# Patient Record
Sex: Female | Born: 1950 | Race: White | Hispanic: No | Marital: Married | Smoking: Former smoker
Health system: Southern US, Community
[De-identification: ages and names within clinical notes are randomized; demographics above are authoritative.]

## PROBLEM LIST (undated history)

## (undated) DIAGNOSIS — E119 Type 2 diabetes mellitus without complications: Secondary | ICD-10-CM

## (undated) DIAGNOSIS — I10 Essential (primary) hypertension: Secondary | ICD-10-CM

## (undated) DIAGNOSIS — M1990 Unspecified osteoarthritis, unspecified site: Secondary | ICD-10-CM

## (undated) DIAGNOSIS — Z87442 Personal history of urinary calculi: Secondary | ICD-10-CM

## (undated) DIAGNOSIS — E785 Hyperlipidemia, unspecified: Secondary | ICD-10-CM

## (undated) DIAGNOSIS — E041 Nontoxic single thyroid nodule: Secondary | ICD-10-CM

## (undated) DIAGNOSIS — T753XXA Motion sickness, initial encounter: Secondary | ICD-10-CM

## (undated) DIAGNOSIS — K219 Gastro-esophageal reflux disease without esophagitis: Secondary | ICD-10-CM

## (undated) HISTORY — PX: COLONOSCOPY: SHX174

## (undated) HISTORY — PX: CYSTOSCOPY WITH HOLMIUM LASER LITHOTRIPSY: SHX6639

## (undated) HISTORY — PX: OVARIAN CYST REMOVAL: SHX89

---

## 2004-02-04 ENCOUNTER — Other Ambulatory Visit: Payer: Self-pay

## 2005-03-20 ENCOUNTER — Ambulatory Visit: Payer: Self-pay | Admitting: Obstetrics and Gynecology

## 2006-03-18 ENCOUNTER — Ambulatory Visit: Payer: Self-pay | Admitting: Gastroenterology

## 2006-03-25 ENCOUNTER — Ambulatory Visit: Payer: Self-pay | Admitting: Obstetrics and Gynecology

## 2007-04-18 ENCOUNTER — Ambulatory Visit: Payer: Self-pay | Admitting: Obstetrics and Gynecology

## 2008-04-23 ENCOUNTER — Ambulatory Visit: Payer: Self-pay | Admitting: Obstetrics and Gynecology

## 2009-04-26 ENCOUNTER — Ambulatory Visit: Payer: Self-pay | Admitting: Obstetrics and Gynecology

## 2010-05-08 ENCOUNTER — Ambulatory Visit: Payer: Self-pay | Admitting: Obstetrics and Gynecology

## 2010-05-10 ENCOUNTER — Ambulatory Visit: Payer: Self-pay | Admitting: Obstetrics and Gynecology

## 2010-10-30 ENCOUNTER — Ambulatory Visit: Payer: Self-pay | Admitting: Obstetrics and Gynecology

## 2011-05-17 ENCOUNTER — Ambulatory Visit: Payer: Self-pay | Admitting: Obstetrics and Gynecology

## 2011-05-23 ENCOUNTER — Ambulatory Visit: Payer: Self-pay | Admitting: Obstetrics and Gynecology

## 2011-11-27 ENCOUNTER — Ambulatory Visit: Payer: Self-pay | Admitting: Gastroenterology

## 2011-12-11 ENCOUNTER — Ambulatory Visit: Payer: Self-pay | Admitting: Internal Medicine

## 2011-12-18 ENCOUNTER — Ambulatory Visit: Payer: Self-pay | Admitting: Gastroenterology

## 2012-06-10 ENCOUNTER — Ambulatory Visit: Payer: Self-pay | Admitting: Internal Medicine

## 2013-06-11 ENCOUNTER — Ambulatory Visit: Payer: Self-pay | Admitting: Internal Medicine

## 2014-08-19 ENCOUNTER — Ambulatory Visit: Payer: Self-pay | Admitting: Internal Medicine

## 2015-07-19 ENCOUNTER — Other Ambulatory Visit: Payer: Self-pay | Admitting: Internal Medicine

## 2015-07-19 DIAGNOSIS — Z1231 Encounter for screening mammogram for malignant neoplasm of breast: Secondary | ICD-10-CM

## 2015-08-22 ENCOUNTER — Ambulatory Visit
Admission: RE | Admit: 2015-08-22 | Discharge: 2015-08-22 | Disposition: A | Discharge status: Home / Self Care | Payer: Medicare HMO | Source: Ambulatory Visit | Attending: Internal Medicine | Admitting: Internal Medicine

## 2015-08-22 ENCOUNTER — Other Ambulatory Visit: Payer: Self-pay | Admitting: Internal Medicine

## 2015-08-22 DIAGNOSIS — Z1231 Encounter for screening mammogram for malignant neoplasm of breast: Secondary | ICD-10-CM | POA: Diagnosis present

## 2016-07-18 ENCOUNTER — Other Ambulatory Visit: Payer: Self-pay | Admitting: Internal Medicine

## 2016-07-18 DIAGNOSIS — Z1231 Encounter for screening mammogram for malignant neoplasm of breast: Secondary | ICD-10-CM

## 2016-08-24 ENCOUNTER — Ambulatory Visit
Admission: RE | Admit: 2016-08-24 | Discharge: 2016-08-24 | Disposition: A | Discharge status: Home / Self Care | Payer: Medicare HMO | Source: Ambulatory Visit | Attending: Internal Medicine | Admitting: Internal Medicine

## 2016-08-24 DIAGNOSIS — Z1231 Encounter for screening mammogram for malignant neoplasm of breast: Secondary | ICD-10-CM | POA: Diagnosis not present

## 2016-09-17 ENCOUNTER — Telehealth: Payer: Self-pay

## 2016-09-17 ENCOUNTER — Other Ambulatory Visit: Payer: Self-pay

## 2016-09-17 DIAGNOSIS — F419 Anxiety disorder, unspecified: Secondary | ICD-10-CM | POA: Insufficient documentation

## 2016-09-17 DIAGNOSIS — E040 Nontoxic diffuse goiter: Secondary | ICD-10-CM | POA: Insufficient documentation

## 2016-09-17 DIAGNOSIS — I10 Essential (primary) hypertension: Secondary | ICD-10-CM | POA: Insufficient documentation

## 2016-09-17 DIAGNOSIS — E785 Hyperlipidemia, unspecified: Secondary | ICD-10-CM | POA: Insufficient documentation

## 2016-09-17 DIAGNOSIS — K219 Gastro-esophageal reflux disease without esophagitis: Secondary | ICD-10-CM | POA: Insufficient documentation

## 2016-09-17 DIAGNOSIS — E119 Type 2 diabetes mellitus without complications: Secondary | ICD-10-CM | POA: Insufficient documentation

## 2016-09-17 NOTE — Telephone Encounter (Signed)
Gastroenterology Pre-Procedure Review  Request Date:  Requesting Physician: Dr.   PATIENT REVIEW QUESTIONS: The patient responded to the following health history questions as indicated:    1. Are you having any GI issues? no 2. Do you have a personal history of Polyps? no 3. Do you have a family history of Colon Cancer or Polyps? no 4. Diabetes Mellitus? yes (Type 2) 5. Joint replacements in the past 12 months?no 6. Major health problems in the past 3 months?no 7. Any artificial heart valves, MVP, or defibrillator?no    MEDICATIONS & ALLERGIES:    Patient reports the following regarding taking any anticoagulation/antiplatelet therapy:   Plavix, Coumadin, Eliquis, Xarelto, Lovenox, Pradaxa, Brilinta, or Effient? no Aspirin? no  Patient confirms/reports the following medications:  Current Outpatient Prescriptions  Medication Sig Dispense Refill  . ALPRAZolam (XANAX) 0.25 MG tablet TAKE 1 TABLET BY MOUTH EVERY DAY AS NEEDED    . glipiZIDE (GLUCOTROL XL) 5 MG 24 hr tablet Take by mouth.    . losartan-hydrochlorothiazide (HYZAAR) 100-12.5 MG tablet TAKE 1 TABLET BY MOUTH EVERY DAY    . metFORMIN (GLUCOPHAGE) 1000 MG tablet Take by mouth.    . mometasone (ELOCON) 0.1 % cream Apply topically.    Marland Kitchen omeprazole (PRILOSEC) 40 MG capsule Take by mouth.    . simvastatin (ZOCOR) 80 MG tablet TAKE 1 TABLET(80 MG) BY MOUTH EVERY DAY    . valACYclovir (VALTREX) 1000 MG tablet 1 tab po qd x 2 days    . Vitamin D, Ergocalciferol, (DRISDOL) 50000 units CAPS capsule TAKE 1 CAPSULE BY MOUTH ONCE A WEEK    . BIONECT 0.2 % CREA APPLY TO THE AFFECTED AREA TOPICALLY EVERY DAY  0  . metFORMIN (GLUCOPHAGE) 1000 MG tablet     . Multiple Vitamin (MULTI-VITAMINS) TABS Take by mouth.    Marland Kitchen omeprazole (PRILOSEC) 40 MG capsule   1  . simvastatin (ZOCOR) 80 MG tablet      No current facility-administered medications for this visit.     Patient confirms/reports the following allergies:  Allergies not on  file  No orders of the defined types were placed in this encounter.   AUTHORIZATION INFORMATION Primary Insurance: 1D#: Group #:  Secondary Insurance: 1D#: Group #:  SCHEDULE INFORMATION: Date: 10/22/16 Time: Location: [place]

## 2016-09-24 ENCOUNTER — Encounter: Admission: RE | Payer: Self-pay | Source: Ambulatory Visit

## 2016-09-24 ENCOUNTER — Ambulatory Visit: Admission: RE | Admit: 2016-09-24 | Payer: Medicare HMO | Source: Ambulatory Visit | Admitting: Gastroenterology

## 2016-09-24 SURGERY — COLONOSCOPY WITH PROPOFOL
Anesthesia: General

## 2016-10-16 ENCOUNTER — Encounter: Payer: Self-pay | Admitting: *Deleted

## 2016-10-19 NOTE — Discharge Instructions (Signed)

## 2016-10-22 ENCOUNTER — Ambulatory Visit
Admission: RE | Admit: 2016-10-22 | Discharge: 2016-10-22 | Disposition: A | Discharge status: Home / Self Care | Payer: Medicare HMO | Source: Ambulatory Visit | Attending: Gastroenterology | Admitting: Gastroenterology

## 2016-10-22 ENCOUNTER — Ambulatory Visit: Payer: Medicare HMO | Admitting: Anesthesiology

## 2016-10-22 ENCOUNTER — Encounter
Admission: RE | Disposition: A | Discharge status: Home / Self Care | Payer: Self-pay | Source: Ambulatory Visit | Attending: Gastroenterology

## 2016-10-22 DIAGNOSIS — D124 Benign neoplasm of descending colon: Secondary | ICD-10-CM | POA: Insufficient documentation

## 2016-10-22 DIAGNOSIS — Z7984 Long term (current) use of oral hypoglycemic drugs: Secondary | ICD-10-CM | POA: Diagnosis not present

## 2016-10-22 DIAGNOSIS — M1990 Unspecified osteoarthritis, unspecified site: Secondary | ICD-10-CM | POA: Diagnosis not present

## 2016-10-22 DIAGNOSIS — Z87891 Personal history of nicotine dependence: Secondary | ICD-10-CM | POA: Diagnosis not present

## 2016-10-22 DIAGNOSIS — I10 Essential (primary) hypertension: Secondary | ICD-10-CM | POA: Diagnosis not present

## 2016-10-22 DIAGNOSIS — E785 Hyperlipidemia, unspecified: Secondary | ICD-10-CM | POA: Insufficient documentation

## 2016-10-22 DIAGNOSIS — K640 First degree hemorrhoids: Secondary | ICD-10-CM | POA: Insufficient documentation

## 2016-10-22 DIAGNOSIS — K5730 Diverticulosis of large intestine without perforation or abscess without bleeding: Secondary | ICD-10-CM | POA: Diagnosis not present

## 2016-10-22 DIAGNOSIS — Z1211 Encounter for screening for malignant neoplasm of colon: Secondary | ICD-10-CM | POA: Diagnosis not present

## 2016-10-22 DIAGNOSIS — D125 Benign neoplasm of sigmoid colon: Secondary | ICD-10-CM

## 2016-10-22 DIAGNOSIS — K219 Gastro-esophageal reflux disease without esophagitis: Secondary | ICD-10-CM | POA: Insufficient documentation

## 2016-10-22 DIAGNOSIS — E119 Type 2 diabetes mellitus without complications: Secondary | ICD-10-CM | POA: Insufficient documentation

## 2016-10-22 DIAGNOSIS — Z79899 Other long term (current) drug therapy: Secondary | ICD-10-CM | POA: Insufficient documentation

## 2016-10-22 DIAGNOSIS — D123 Benign neoplasm of transverse colon: Secondary | ICD-10-CM | POA: Diagnosis not present

## 2016-10-22 DIAGNOSIS — K635 Polyp of colon: Secondary | ICD-10-CM

## 2016-10-22 HISTORY — DX: Hyperlipidemia, unspecified: E78.5

## 2016-10-22 HISTORY — DX: Gastro-esophageal reflux disease without esophagitis: K21.9

## 2016-10-22 HISTORY — DX: Type 2 diabetes mellitus without complications: E11.9

## 2016-10-22 HISTORY — DX: Personal history of urinary calculi: Z87.442

## 2016-10-22 HISTORY — DX: Essential (primary) hypertension: I10

## 2016-10-22 HISTORY — DX: Unspecified osteoarthritis, unspecified site: M19.90

## 2016-10-22 HISTORY — DX: Nontoxic single thyroid nodule: E04.1

## 2016-10-22 HISTORY — DX: Motion sickness, initial encounter: T75.3XXA

## 2016-10-22 HISTORY — PX: COLONOSCOPY WITH PROPOFOL: SHX5780

## 2016-10-22 HISTORY — PX: POLYPECTOMY: SHX5525

## 2016-10-22 LAB — GLUCOSE, CAPILLARY: GLUCOSE-CAPILLARY: 199 mg/dL — AB (ref 65–99)

## 2016-10-22 SURGERY — COLONOSCOPY WITH PROPOFOL
Anesthesia: Choice | Wound class: Contaminated

## 2016-10-22 MED ORDER — ACETAMINOPHEN 325 MG PO TABS: 325.0000 mg | ORAL_TABLET | ORAL | Status: DC | PRN

## 2016-10-22 MED ORDER — ACETAMINOPHEN 160 MG/5ML PO SOLN: 325.0000 mg | ORAL | Status: DC | PRN

## 2016-10-22 MED ORDER — LIDOCAINE HCL (CARDIAC) 20 MG/ML IV SOLN
INTRAVENOUS | Status: DC | PRN
  Administered 2016-10-22: 20 mg via INTRAVENOUS

## 2016-10-22 MED ORDER — STERILE WATER FOR IRRIGATION IR SOLN
Status: DC | PRN
  Administered 2016-10-22: 08:00:00

## 2016-10-22 MED ORDER — LACTATED RINGERS IV SOLN
INTRAVENOUS | Status: DC
  Administered 2016-10-22 (×2): via INTRAVENOUS

## 2016-10-22 MED ORDER — PROPOFOL 10 MG/ML IV BOLUS
INTRAVENOUS | Status: DC | PRN
  Administered 2016-10-22: 20 mg via INTRAVENOUS
  Administered 2016-10-22: 30 mg via INTRAVENOUS
  Administered 2016-10-22: 20 mg via INTRAVENOUS
  Administered 2016-10-22: 80 mg via INTRAVENOUS
  Administered 2016-10-22 (×2): 20 mg via INTRAVENOUS
  Administered 2016-10-22: 40 mg via INTRAVENOUS

## 2016-10-22 SURGICAL SUPPLY — 23 items

## 2016-10-22 NOTE — Anesthesia Preprocedure Evaluation (Signed)
Anesthesia Evaluation  Patient identified by MRN, date of birth, ID band Patient awake    Reviewed: Allergy & Precautions, H&P , NPO status , Patient's Chart, lab work & pertinent test results  Airway Mallampati: II  TM Distance: >3 FB Neck ROM: full    Dental no notable dental hx.    Pulmonary former smoker,    Pulmonary exam normal        Cardiovascular hypertension, Normal cardiovascular exam     Neuro/Psych    GI/Hepatic GERD  ,  Endo/Other  diabetesMorbid obesity  Renal/GU      Musculoskeletal   Abdominal   Peds  Hematology   Anesthesia Other Findings   Reproductive/Obstetrics                             Anesthesia Physical Anesthesia Plan  ASA: II  Anesthesia Plan:    Post-op Pain Management:    Induction:   Airway Management Planned:   Additional Equipment:   Intra-op Plan:   Post-operative Plan:   Informed Consent: I have reviewed the patients History and Physical, chart, labs and discussed the procedure including the risks, benefits and alternatives for the proposed anesthesia with the patient or authorized representative who has indicated his/her understanding and acceptance.     Plan Discussed with:   Anesthesia Plan Comments:         Anesthesia Quick Evaluation

## 2016-10-22 NOTE — Op Note (Signed)
[facility] Gastroenterology Patient Name: Victoria Boone Procedure Date: 10/22/2016 8:12 AM MRN: [id number] Account #: [id number] Date of Birth: [birth year] Admit Type: Outpatient Age: 66 Room: [facility] OR ROOM 01 Gender: Female Note Status: Finalized Procedure:            Colonoscopy Indications:          Screening for colorectal malignant neoplasm Providers:            Lucilla Lame MD, MD Referring MD:         Tracie Harrier, MD (Referring MD) Medicines:            Propofol per Anesthesia Complications:        No immediate complications. Procedure:            Pre-Anesthesia Assessment:                       - Prior to the procedure, a History and Physical was                        performed, and patient medications and allergies were                        reviewed. The patient's tolerance of previous                        anesthesia was also reviewed. The risks and benefits of                        the procedure and the sedation options and risks were                        discussed with the patient. All questions were                        answered, and informed consent was obtained. Prior                        Anticoagulants: The patient has taken no previous                        anticoagulant or antiplatelet agents. ASA Grade                        Assessment: II - A patient with mild systemic disease.                        After reviewing the risks and benefits, the patient was                        deemed in satisfactory condition to undergo the                        procedure.                       After obtaining informed consent, the colonoscope was                        passed under direct vision. Throughout the procedure,  the patient's blood pressure, pulse, and oxygen                        saturations were monitored continuously. The [place] [id number]) was introduced through  the                        anus and advanced to the the cecum, identified by                        appendiceal orifice and ileocecal valve. The                        colonoscopy was performed without difficulty. The                        patient tolerated the procedure well. The quality of                        the bowel preparation was excellent. Findings:      The perianal and digital rectal examinations were normal.      A 5 mm polyp was found in the transverse colon. The polyp was sessile.       The polyp was removed with a cold snare. Resection and retrieval were       complete.      A 5 mm polyp was found in the descending colon. The polyp was sessile.       The polyp was removed with a cold snare. Resection and retrieval were       complete.      A 3 mm polyp was found in the sigmoid colon. The polyp was sessile. The       polyp was removed with a cold snare. Resection and retrieval were       complete.      Multiple small-mouthed diverticula were found in the sigmoid colon and       ascending colon.      Non-bleeding internal hemorrhoids were found during retroflexion. The       hemorrhoids were Grade I (internal hemorrhoids that do not prolapse). Impression:           - One 5 mm polyp in the transverse colon, removed with                        a cold snare. Resected and retrieved.                       - One 5 mm polyp in the descending colon, removed with                        a cold snare. Resected and retrieved.                       - One 3 mm polyp in the sigmoid colon, removed with a                        cold snare. Resected and retrieved.                       -  Diverticulosis in the sigmoid colon and in the                        ascending colon.                       - Non-bleeding internal hemorrhoids. Recommendation:       - Discharge patient to home.                       - Resume previous diet.                       - Continue present medications.                        - Await pathology results.                       - Repeat colonoscopy in 5 years if polyp adenoma and 10                        years if hyperplastic Procedure Code(s):    --- Professional ---                       [phone], Colonoscopy, flexible; with removal of tumor(s),                        polyp(s), or other lesion(s) by snare technique Diagnosis Code(s):    --- Professional ---                       Z12.11, Encounter for screening for malignant neoplasm                        of colon                       D12.3, Benign neoplasm of transverse colon (hepatic                        flexure or splenic flexure)                       D12.5, Benign neoplasm of sigmoid colon                       D12.4, Benign neoplasm of descending colon CPT copyright 2016 American Medical Association. All rights reserved. The codes documented in this report are preliminary and upon coder review may  be revised to meet current compliance requirements. Lucilla Lame MD, MD 10/22/2016 8:35:14 AM This report has been signed electronically. Number of Addenda: 0 Note Initiated On: 10/22/2016 8:12 AM Scope Withdrawal Time: 0 hours 9 minutes 37 seconds  Total Procedure Duration: 0 hours 14 minutes 4 seconds       [facility]

## 2016-10-22 NOTE — Anesthesia Procedure Notes (Signed)
Procedure Name: MAC Performed by: Dashiell Franchino Pre-anesthesia Checklist: Patient identified, Emergency Drugs available, Suction available, Patient being monitored and Timeout performed Patient Re-evaluated:Patient Re-evaluated prior to inductionOxygen Delivery Method: Nasal cannula       

## 2016-10-22 NOTE — H&P (Signed)
Lucilla Lame, MD [facility] [address], [place], [place] [address] Phone: [phone] Fax : [phone]  Primary Care Physician:  No primary care provider on file. Primary Gastroenterologist:  Dr. Allen Norris  Pre-Procedure History & Physical: HPI:  Victoria Boone is a 66 y.o. female is here for a screening colonoscopy.   Past Medical History:  Diagnosis Date  . Arthritis    left thumb  . Benign thyroid cyst   . Diabetes mellitus without complication ([place])   . GERD (gastroesophageal reflux disease)   . History of kidney stones   . Hyperlipidemia   . Hypertension   . Motion sickness    all moving vehicles    Past Surgical History:  Procedure Laterality Date  . COLONOSCOPY    . CYSTOSCOPY WITH HOLMIUM LASER LITHOTRIPSY    . OVARIAN CYST REMOVAL      Prior to Admission medications   Medication Sig Start Date End Date Taking? Authorizing Provider  ALPRAZolam (XANAX) 0.25 MG tablet TAKE 1 TABLET BY MOUTH EVERY DAY AS NEEDED 03/23/16  Yes Historical Provider, MD  BIONECT 0.2 % CREA APPLY TO THE AFFECTED AREA TOPICALLY EVERY DAY 08/09/16  Yes Historical Provider, MD  glipiZIDE (GLUCOTROL XL) 5 MG 24 hr tablet Take by mouth. 08/31/16 08/31/17 Yes Historical Provider, MD  KRILL OIL PO Take by mouth 2 (two) times daily.   Yes Historical Provider, MD  losartan-hydrochlorothiazide (HYZAAR) 100-12.5 MG tablet TAKE 1 TABLET BY MOUTH EVERY DAY 08/07/16  Yes Historical Provider, MD  metFORMIN (GLUCOPHAGE) 1000 MG tablet Take by mouth. 08/31/16  Yes Historical Provider, MD  mometasone (ELOCON) 0.1 % cream Apply topically. 01/19/16 01/18/17 Yes Historical Provider, MD  Multiple Vitamin (MULTI-VITAMINS) TABS Take by mouth.   Yes Historical Provider, MD  omeprazole (PRILOSEC) 40 MG capsule Take by mouth. 08/31/16  Yes Historical Provider, MD  Probiotic Product (PROBIOTIC PO) Take by mouth daily.   Yes Historical Provider, MD  simvastatin (ZOCOR) 80 MG tablet TAKE 1 TABLET(80 MG) BY MOUTH EVERY DAY  08/13/16  Yes Historical Provider, MD  Vitamin D, Ergocalciferol, (DRISDOL) 50000 units CAPS capsule TAKE 1 CAPSULE BY MOUTH ONCE A WEEK 02/01/16  Yes Historical Provider, MD  valACYclovir (VALTREX) 1000 MG tablet 1 tab po qd x 2 days 08/31/16   Historical Provider, MD    Allergies as of 09/17/2016  . (Not on File)    History reviewed. No pertinent family history.  Social History   Social History  . Marital status: Married    Spouse name: N/A  . Number of children: N/A  . Years of education: N/A   Occupational History  . Not on file.   Social History Main Topics  . Smoking status: Former Smoker    Quit date: 07/30/1996  . Smokeless tobacco: Never Used  . Alcohol use No  . Drug use: Unknown  . Sexual activity: Not on file   Other Topics Concern  . Not on file   Social History Narrative  . No narrative on file    Review of Systems: See HPI, otherwise negative ROS  Physical Exam: Ht 5\' 4"  (1.626 m)   Wt 200 lb (90.7 kg)   BMI 34.33 kg/m  General:   Alert,  pleasant and cooperative in NAD Head:  Normocephalic and atraumatic. Neck:  Supple; no masses or thyromegaly. Lungs:  Clear throughout to auscultation.    Heart:  Regular rate and rhythm. Abdomen:  Soft, nontender and nondistended. Normal bowel sounds, without guarding, and without rebound.  Neurologic:  Alert and  oriented x4;  grossly normal neurologically.  Impression/Plan: Victoria Boone is now here to undergo a screening colonoscopy.  Risks, benefits, and alternatives regarding colonoscopy have been reviewed with the patient.  Questions have been answered.  All parties agreeable.

## 2016-10-22 NOTE — Transfer of Care (Signed)
Immediate Anesthesia Transfer of Care Note  Patient: Victoria Boone  Procedure(s) Performed: Procedure(s) with comments: COLONOSCOPY WITH PROPOFOL (N/A) - diabetic - oral meds POLYPECTOMY  Patient Location: PACU  Anesthesia Type: No value filed.  Level of Consciousness: awake, alert  and patient cooperative  Airway and Oxygen Therapy: Patient Spontanous Breathing and Patient connected to supplemental oxygen  Post-op Assessment: Post-op Vital signs reviewed, Patient's Cardiovascular Status Stable, Respiratory Function Stable, Patent Airway and No signs of Nausea or vomiting  Post-op Vital Signs: Reviewed and stable  Complications: No apparent anesthesia complications

## 2016-10-22 NOTE — Anesthesia Postprocedure Evaluation (Signed)
Anesthesia Post Note  Patient: Victoria Boone  Procedure(s) Performed: Procedure(s) (LRB): COLONOSCOPY WITH PROPOFOL (N/A) POLYPECTOMY  Patient location during evaluation: PACU Level of consciousness: awake and alert and oriented Pain management: satisfactory to patient Vital Signs Assessment: post-procedure vital signs reviewed and stable Respiratory status: spontaneous breathing, nonlabored ventilation and respiratory function stable Cardiovascular status: blood pressure returned to baseline and stable Postop Assessment: Adequate PO intake and No signs of nausea or vomiting Anesthetic complications: no    Raliegh Ip

## 2016-10-23 ENCOUNTER — Encounter: Payer: Self-pay | Admitting: Gastroenterology

## 2016-10-24 ENCOUNTER — Encounter: Payer: Self-pay | Admitting: Gastroenterology

## 2016-10-25 ENCOUNTER — Encounter: Payer: Self-pay | Admitting: Gastroenterology

## 2016-11-02 ENCOUNTER — Telehealth: Payer: Self-pay | Admitting: Gastroenterology

## 2016-11-02 NOTE — Telephone Encounter (Signed)
Please call patient with colonsocopy results.

## 2016-11-02 NOTE — Telephone Encounter (Signed)
Pt notified results letter was mailed out. Reprinted letter and results and mailed again.

## 2016-11-13 ENCOUNTER — Other Ambulatory Visit: Payer: Self-pay

## 2017-07-18 ENCOUNTER — Other Ambulatory Visit: Payer: Self-pay | Admitting: Internal Medicine

## 2017-07-18 DIAGNOSIS — Z1231 Encounter for screening mammogram for malignant neoplasm of breast: Secondary | ICD-10-CM

## 2017-08-21 ENCOUNTER — Encounter: Payer: Self-pay | Admitting: Gastroenterology

## 2017-08-21 ENCOUNTER — Other Ambulatory Visit: Payer: Self-pay

## 2017-08-21 ENCOUNTER — Ambulatory Visit: Payer: Medicare HMO | Admitting: Gastroenterology

## 2017-08-21 ENCOUNTER — Encounter (INDEPENDENT_AMBULATORY_CARE_PROVIDER_SITE_OTHER): Payer: Self-pay

## 2017-08-21 VITALS — BP 152/69 | HR 97 | Ht 64.0 in | Wt 222.0 lb

## 2017-08-21 DIAGNOSIS — R1013 Epigastric pain: Secondary | ICD-10-CM

## 2017-08-21 DIAGNOSIS — R1084 Generalized abdominal pain: Secondary | ICD-10-CM | POA: Diagnosis not present

## 2017-08-21 NOTE — Progress Notes (Signed)
Primary Care Physician: Tracie Harrier, MD  Primary Gastroenterologist:  Dr. Lucilla Lame  Chief Complaint  Patient presents with  . Abdominal Pain  . Gas  . Diarrhea    HPI: Victoria Boone is a 67 y.o. female here after having abdominal pain that started 4 weeks ago.  The patient states she started having abdominal pain with some bloating and, given by retching and diarrhea.  The patient states that her symptoms are so bad she thought she was going to go to the emergency room.  The patient states in the last week she has gone back to normal without any problems.  There is no report of any unexplained weight loss black stools or bloody stools.  The patient has had a colonoscopy in the past that showed an adenomatous polyp and is not due for colonoscopy at this time.  The patient also reports that she has chronic pain in her left flank that is not associated with eating or drinking.  She also states the pain is not associated with bowel movements. The patient denies any sick contacts and reports that she is now back to her usual bowel habits at this time.  During the episode of abdominal pain with bloating for 3 week she had a lot of burping and passing gas from the bottom.  One night was particularly bad with her having a lot of retching with resulting diarrhea after that.  Current Outpatient Medications  Medication Sig Dispense Refill  . ALPRAZolam (XANAX) 0.25 MG tablet TAKE 1 TABLET BY MOUTH EVERY DAY AS NEEDED    . BIONECT 0.2 % CREA APPLY TO THE AFFECTED AREA TOPICALLY EVERY DAY  0  . Continuous Blood Gluc Receiver (FREESTYLE LIBRE READER) DEVI Use 1 kit as directed    . Continuous Blood Gluc Sensor (FREESTYLE LIBRE SENSOR SYSTEM) MISC Use 1 kit every 10 (ten) days    . glipiZIDE (GLUCOTROL XL) 5 MG 24 hr tablet Take by mouth.    Marland Kitchen KRILL OIL PO Take by mouth 2 (two) times daily.    Marland Kitchen losartan-hydrochlorothiazide (HYZAAR) 100-12.5 MG tablet TAKE 1 TABLET BY MOUTH EVERY DAY    .  metFORMIN (GLUCOPHAGE) 1000 MG tablet Take by mouth.    . Multiple Vitamin (MULTI-VITAMINS) TABS Take by mouth.    Marland Kitchen omeprazole (PRILOSEC) 40 MG capsule Take by mouth.    . simvastatin (ZOCOR) 80 MG tablet TAKE 1 TABLET(80 MG) BY MOUTH EVERY DAY    . TRULICITY 3.32 RJ/1.8AC SOPN INJECT THE CONTENTS OF ONE SYRINGE INTO THE SKIN EVERY WEEK.  3  . valACYclovir (VALTREX) 1000 MG tablet 1 tab po qd x 2 days    . Vitamin D, Ergocalciferol, (DRISDOL) 50000 units CAPS capsule TAKE 1 CAPSULE BY MOUTH ONCE A WEEK    . Probiotic Product (PROBIOTIC PO) Take by mouth daily.     No current facility-administered medications for this visit.     Allergies as of 08/21/2017 - Review Complete 08/21/2017  Allergen Reaction Noted  . Rofecoxib  12/23/2013  . Adhesive [tape] Rash 10/16/2016    ROS:  General: Negative for anorexia, weight loss, fever, chills, fatigue, weakness. ENT: Negative for hoarseness, difficulty swallowing , nasal congestion. CV: Negative for chest pain, angina, palpitations, dyspnea on exertion, peripheral edema.  Respiratory: Negative for dyspnea at rest, dyspnea on exertion, cough, sputum, wheezing.  GI: See history of present illness. GU:  Negative for dysuria, hematuria, urinary incontinence, urinary frequency, nocturnal urination.  Endo: Negative for unusual  weight change.    Physical Examination:   BP (!) 152/69   Pulse 97   Ht _0  (1.626 m)   Wt 222 lb (100.7 kg)   BMI 38.11 kg/m   General: Well-nourished, well-developed in no acute distress.  Eyes: No icterus. Conjunctivae pink. Mouth: Oropharyngeal mucosa moist and pink , no lesions erythema or exudate. Lungs: Clear to auscultation bilaterally. Non-labored. Heart: Regular rate and rhythm, no murmurs rubs or gallops.  Abdomen: Bowel sounds are normal, nontender, nondistended, no hepatosplenomegaly or masses, no abdominal bruits or hernia , no rebound or guarding.   Extremities: No lower extremity edema. No clubbing  or deformities. Neuro: Alert and oriented x 3.  Grossly intact. Skin: Warm and dry, no jaundice.   Psych: Alert and cooperative, normal mood and affect.  Labs:    Imaging Studies: No results found.  Assessment and Plan:   Victoria Boone is a 67 y.o. y/o female who comes in today with a history of symptoms starting approximately 4 weeks ago with abdominal bloating and pain with some retching and diarrhea.  The patient states her symptoms have been gone for the last week.  She also reports that she started to take a new medication for her  Diabetes round the time this started.  The medication was Trulicity.  She has not stopped the medication. The patient has been told to double up on her omeprazole if her burping should come back and to contact me if the abdominal pain and her other symptoms return.  Otherwise the patient has been getting better and we will hold off on any invasive studies at this time.  The patient has been explained the plan and agrees with it.    Lucilla Lame, MD. Marval Regal   Note: This dictation was prepared with Dragon dictation along with smaller phrase technology. Any transcriptional errors that result from this process are unintentional.

## 2017-08-27 ENCOUNTER — Ambulatory Visit
Admission: RE | Admit: 2017-08-27 | Discharge: 2017-08-27 | Disposition: A | Discharge status: Home / Self Care | Payer: Medicare HMO | Source: Ambulatory Visit | Attending: Internal Medicine | Admitting: Internal Medicine

## 2017-08-27 DIAGNOSIS — Z1231 Encounter for screening mammogram for malignant neoplasm of breast: Secondary | ICD-10-CM | POA: Insufficient documentation

## 2018-07-31 ENCOUNTER — Other Ambulatory Visit: Payer: Self-pay | Admitting: Internal Medicine

## 2018-07-31 DIAGNOSIS — Z1231 Encounter for screening mammogram for malignant neoplasm of breast: Secondary | ICD-10-CM

## 2018-08-29 ENCOUNTER — Ambulatory Visit
Admission: RE | Admit: 2018-08-29 | Discharge: 2018-08-29 | Disposition: A | Discharge status: Home / Self Care | Payer: Medicare HMO | Source: Ambulatory Visit | Attending: Internal Medicine | Admitting: Internal Medicine

## 2018-08-29 DIAGNOSIS — Z1231 Encounter for screening mammogram for malignant neoplasm of breast: Secondary | ICD-10-CM | POA: Diagnosis present

## 2019-08-22 ENCOUNTER — Ambulatory Visit: Payer: Medicare HMO | Attending: Internal Medicine

## 2019-08-22 DIAGNOSIS — Z23 Encounter for immunization: Secondary | ICD-10-CM

## 2019-08-22 NOTE — Progress Notes (Signed)
   Covid-19 Vaccination Clinic  Name:  Victoria Boone    MRN: [id number] DOB: 1950/09/29  08/22/2019  Victoria Boone was observed post Covid-19 immunization for 15 minutes without incidence. She was provided with Vaccine Information Sheet and instruction to access the V-Safe system.   Victoria Boone was instructed to call 911 with any severe reactions post vaccine: Marland Kitchen Difficulty breathing  . Swelling of your face and throat  . A fast heartbeat  . A bad rash all over your body  . Dizziness and weakness    Immunizations Administered    Name Date Dose VIS Date Route   Pfizer COVID-19 Vaccine 08/22/2019  1:45 PM 0.3 mL 07/10/2019 Intramuscular   Manufacturer: [place]   Lot: [id number]   [place]: [id number]

## 2019-09-09 ENCOUNTER — Other Ambulatory Visit: Payer: Self-pay | Admitting: Internal Medicine

## 2019-09-09 DIAGNOSIS — Z1231 Encounter for screening mammogram for malignant neoplasm of breast: Secondary | ICD-10-CM

## 2019-09-12 ENCOUNTER — Ambulatory Visit: Payer: Medicare HMO | Attending: Internal Medicine

## 2019-09-12 DIAGNOSIS — Z23 Encounter for immunization: Secondary | ICD-10-CM | POA: Insufficient documentation

## 2019-09-12 NOTE — Progress Notes (Signed)
   Covid-19 Vaccination Clinic  Name:  Victoria Boone    MRN: [id number] DOB: 1950/09/12  09/12/2019  Ms. Hildenbrandt was observed post Covid-19 immunization for 15 minutes without incidence. She was provided with Vaccine Information Sheet and instruction to access the V-Safe system.   Ms. Brinley was instructed to call 911 with any severe reactions post vaccine: Marland Kitchen Difficulty breathing  . Swelling of your face and throat  . A fast heartbeat  . A bad rash all over your body  . Dizziness and weakness    Immunizations Administered    Name Date Dose VIS Date Route   Pfizer COVID-19 Vaccine 09/12/2019 12:45 PM 0.3 mL 07/10/2019 Intramuscular   Manufacturer: [place]   Lot: [id number]   [place]: [id number]

## 2019-10-21 ENCOUNTER — Ambulatory Visit
Admission: RE | Admit: 2019-10-21 | Discharge: 2019-10-21 | Disposition: A | Discharge status: Home / Self Care | Payer: Medicare HMO | Source: Ambulatory Visit | Attending: Internal Medicine | Admitting: Internal Medicine

## 2019-10-21 DIAGNOSIS — Z1231 Encounter for screening mammogram for malignant neoplasm of breast: Secondary | ICD-10-CM | POA: Diagnosis present

## 2020-07-20 ENCOUNTER — Other Ambulatory Visit: Payer: Self-pay | Admitting: Internal Medicine

## 2020-07-20 DIAGNOSIS — Z1231 Encounter for screening mammogram for malignant neoplasm of breast: Secondary | ICD-10-CM

## 2020-10-21 ENCOUNTER — Ambulatory Visit
Admission: RE | Admit: 2020-10-21 | Discharge: 2020-10-21 | Disposition: A | Discharge status: Home / Self Care | Payer: Medicare HMO | Source: Ambulatory Visit | Attending: Internal Medicine | Admitting: Internal Medicine

## 2020-10-21 ENCOUNTER — Other Ambulatory Visit: Payer: Self-pay

## 2020-10-21 DIAGNOSIS — Z1231 Encounter for screening mammogram for malignant neoplasm of breast: Secondary | ICD-10-CM | POA: Insufficient documentation

## 2021-04-04 IMAGING — MG DIGITAL SCREENING BILAT W/ TOMO W/ CAD
8 series · 8 of 24 positions shown · non-contrast
Comparison: Previous exam(s).

CLINICAL DATA: Screening.

EXAM:
DIGITAL SCREENING BILATERAL MAMMOGRAM WITH TOMO AND CAD

[R CC synth-2D]
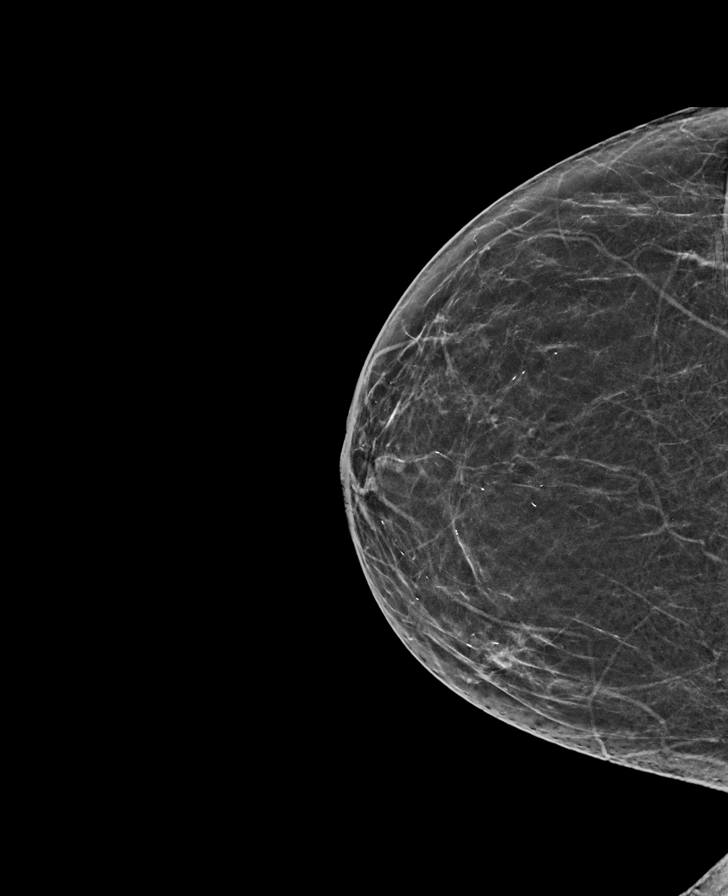

[L MLO synth-2D]
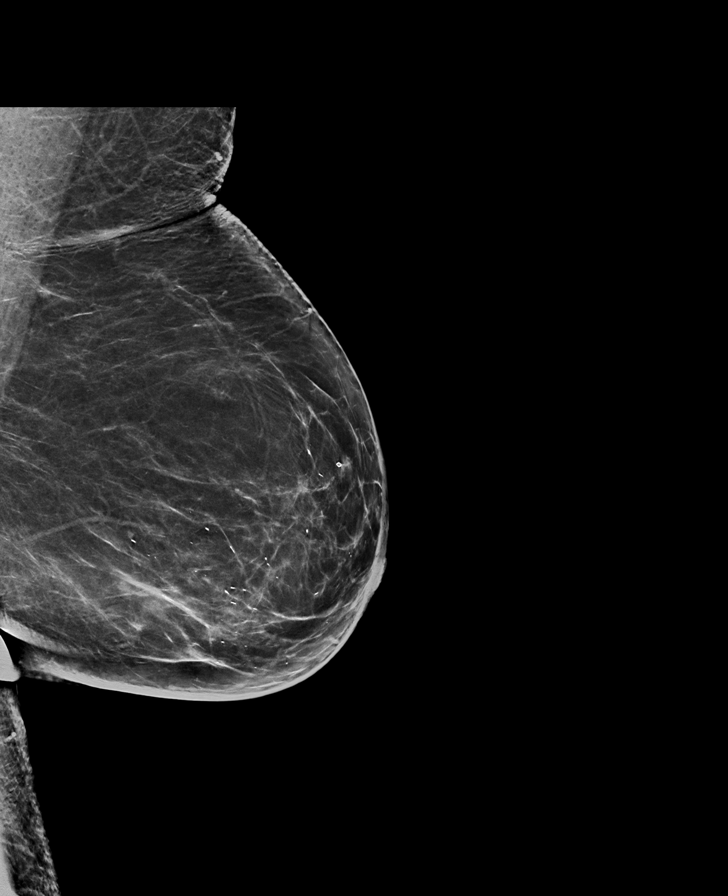

[L CC synth-2D]
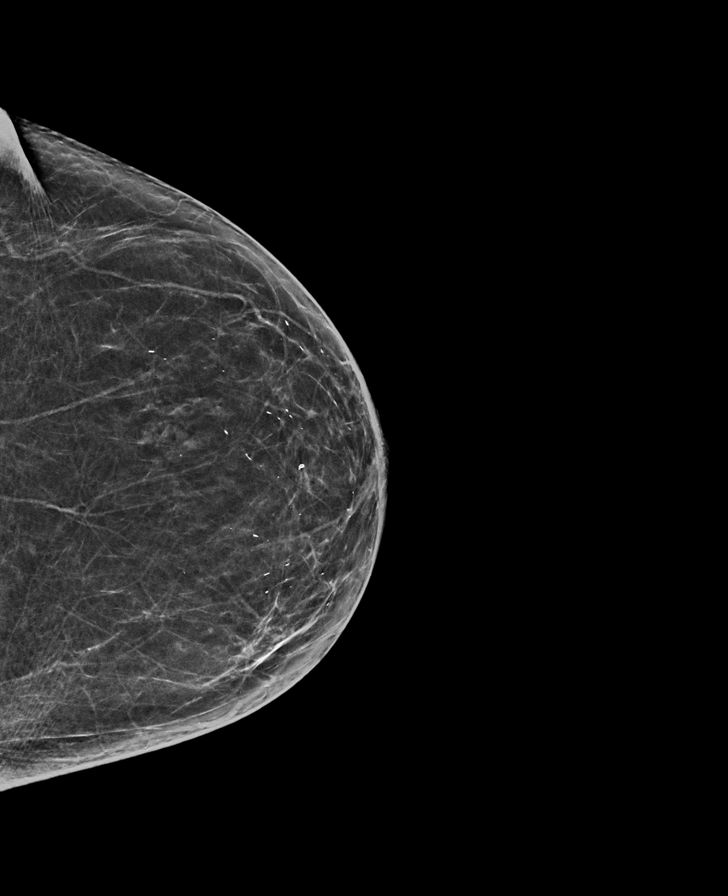

[R MLO synth-2D]
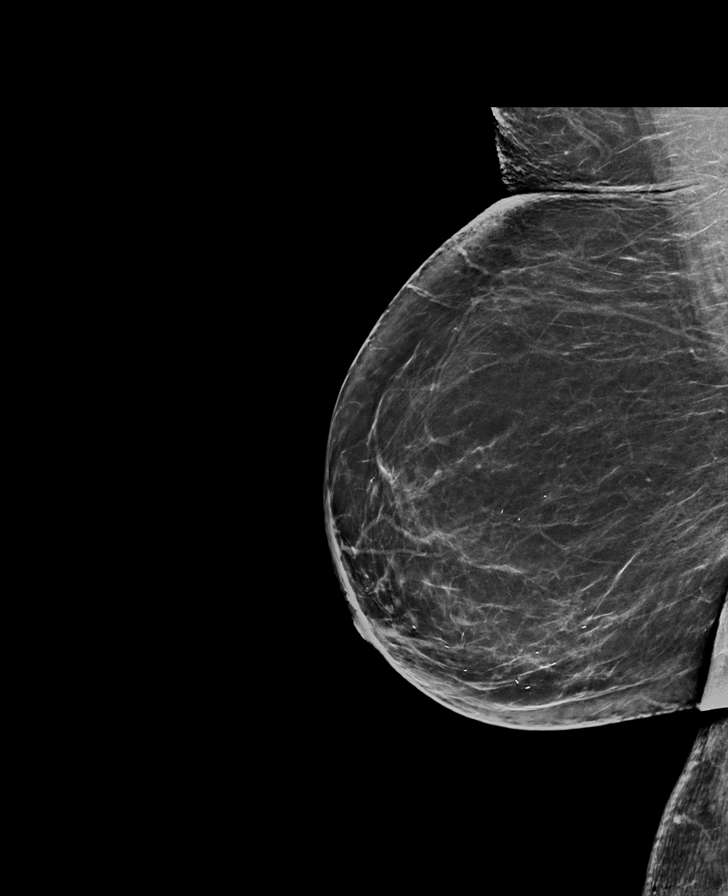

[L MLO tomo · tomo slice 38/75.0]
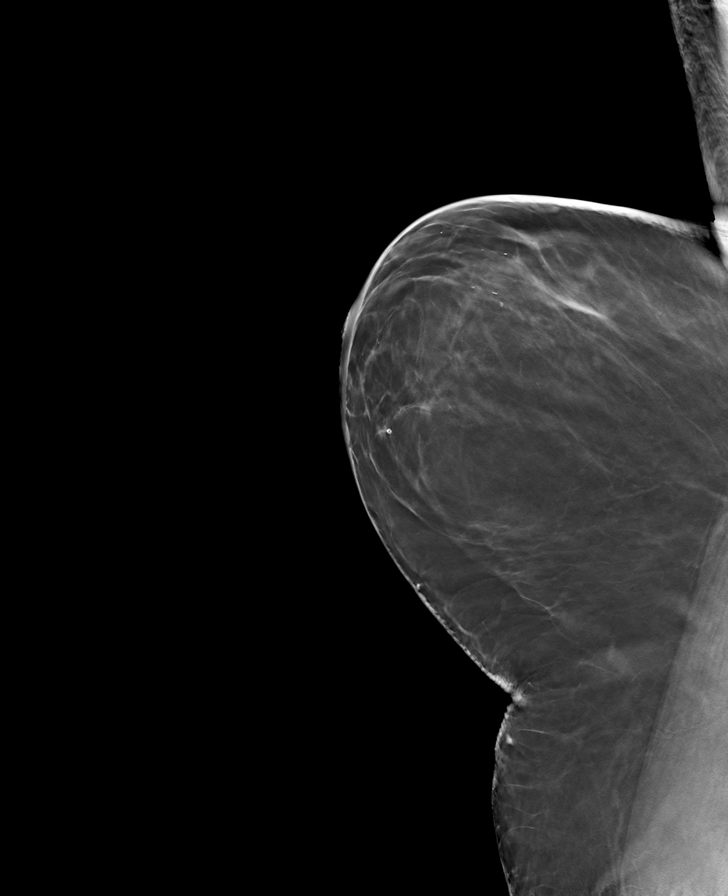

[R MLO tomo · tomo slice 37/74.0]
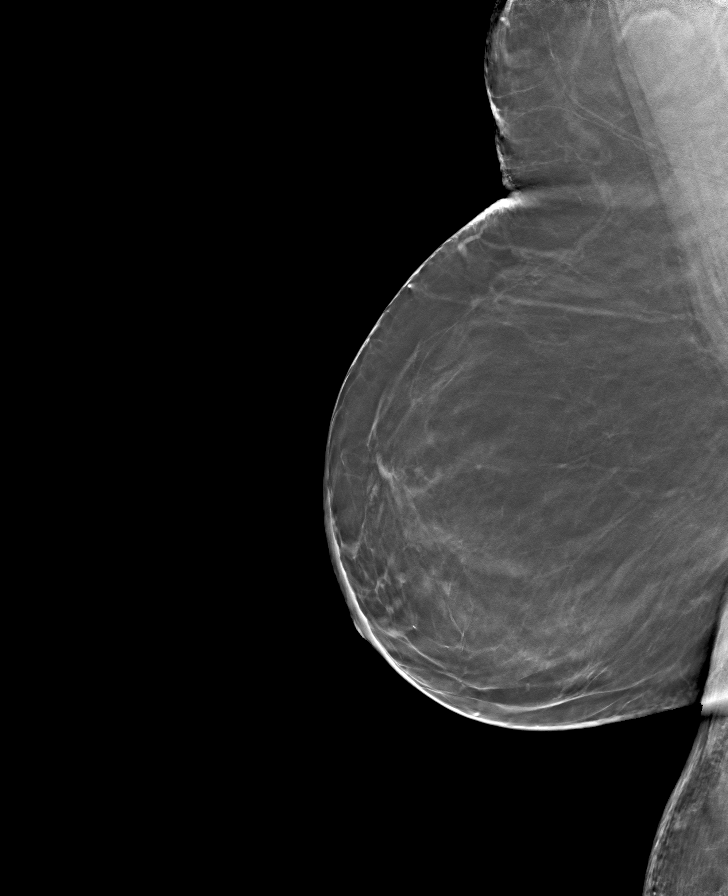

[L CC tomo · tomo slice 31/60.0]
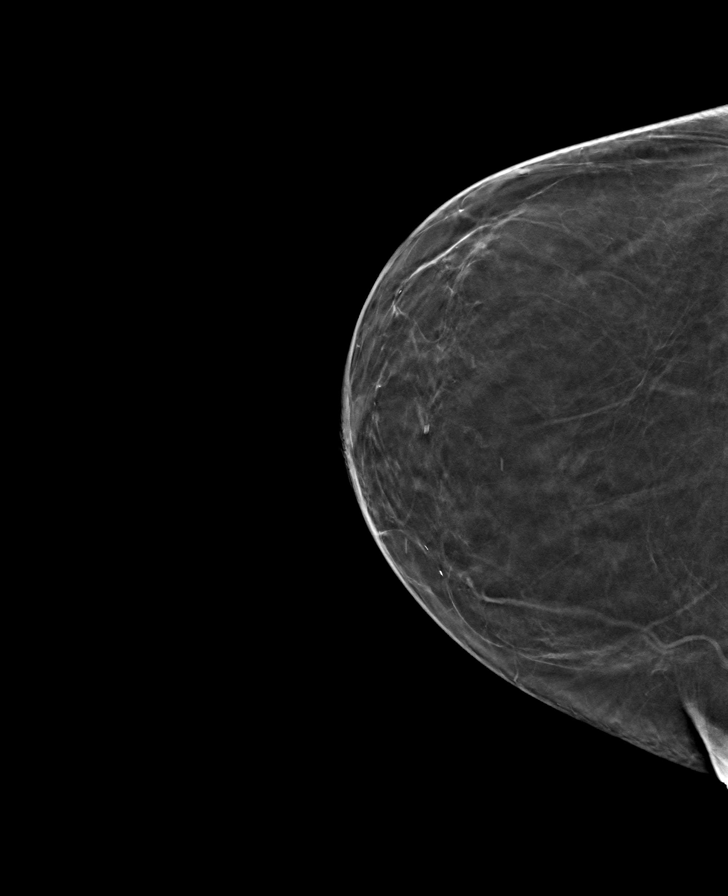

[R CC tomo · tomo slice 29/57.0]
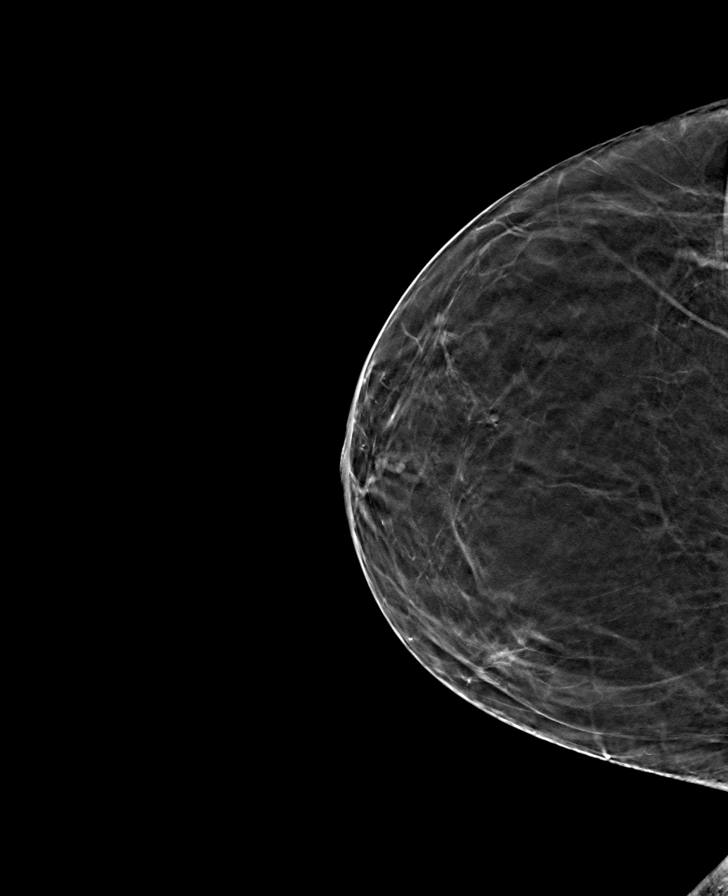

[8 of 24 positions shown; findings below may reference images not displayed]

ACR Breast Density Category b: There are scattered areas of
fibroglandular density.
FINDINGS: There are no findings suspicious for malignancy. Images were
processed with CAD.
IMPRESSION: No mammographic evidence of malignancy. A result letter of this
screening mammogram will be mailed directly to the patient.

RECOMMENDATION:
Screening mammogram in one year. (Code:[id number]-775)

BI-RADS CATEGORY  1: Negative.

## 2021-09-29 ENCOUNTER — Other Ambulatory Visit: Payer: Self-pay | Admitting: Internal Medicine

## 2021-09-29 DIAGNOSIS — Z1231 Encounter for screening mammogram for malignant neoplasm of breast: Secondary | ICD-10-CM

## 2021-10-19 ENCOUNTER — Telehealth: Payer: Self-pay

## 2021-10-19 NOTE — Telephone Encounter (Signed)
SCHEDULED FOR 02/27/2022 ?

## 2021-11-07 ENCOUNTER — Ambulatory Visit
Admission: RE | Admit: 2021-11-07 | Discharge: 2021-11-07 | Disposition: A | Discharge status: Home / Self Care | Payer: Medicare HMO | Source: Ambulatory Visit | Attending: Internal Medicine | Admitting: Internal Medicine

## 2021-11-07 DIAGNOSIS — Z1231 Encounter for screening mammogram for malignant neoplasm of breast: Secondary | ICD-10-CM | POA: Insufficient documentation

## 2022-02-27 ENCOUNTER — Ambulatory Visit: Payer: Medicare HMO | Admitting: Gastroenterology

## 2022-07-27 ENCOUNTER — Other Ambulatory Visit: Payer: Self-pay

## 2022-07-27 ENCOUNTER — Emergency Department
Admission: EM | Admit: 2022-07-27 | Discharge: 2022-07-27 | Disposition: A | Discharge status: Home / Self Care | Payer: Medicare HMO | Attending: Emergency Medicine | Admitting: Emergency Medicine

## 2022-07-27 ENCOUNTER — Emergency Department: Payer: Medicare HMO

## 2022-07-27 ENCOUNTER — Encounter: Payer: Self-pay | Admitting: Emergency Medicine

## 2022-07-27 DIAGNOSIS — R0789 Other chest pain: Secondary | ICD-10-CM | POA: Insufficient documentation

## 2022-07-27 DIAGNOSIS — E119 Type 2 diabetes mellitus without complications: Secondary | ICD-10-CM | POA: Diagnosis not present

## 2022-07-27 DIAGNOSIS — R059 Cough, unspecified: Secondary | ICD-10-CM | POA: Insufficient documentation

## 2022-07-27 DIAGNOSIS — I10 Essential (primary) hypertension: Secondary | ICD-10-CM | POA: Diagnosis not present

## 2022-07-27 DIAGNOSIS — Z1152 Encounter for screening for COVID-19: Secondary | ICD-10-CM | POA: Insufficient documentation

## 2022-07-27 DIAGNOSIS — R058 Other specified cough: Secondary | ICD-10-CM | POA: Diagnosis not present

## 2022-07-27 DIAGNOSIS — R079 Chest pain, unspecified: Secondary | ICD-10-CM

## 2022-07-27 LAB — CBC
HCT: 40.2 % (ref 36.0–46.0)
Hemoglobin: 12.8 g/dL (ref 12.0–15.0)
MCH: 29.4 pg (ref 26.0–34.0)
MCHC: 31.8 g/dL (ref 30.0–36.0)
MCV: 92.2 fL (ref 80.0–100.0)
Platelets: 177 10*3/uL (ref 150–400)
RBC: 4.36 MIL/uL (ref 3.87–5.11)
RDW: 13.3 % (ref 11.5–15.5)
WBC: 7.9 10*3/uL (ref 4.0–10.5)
nRBC: 0 % (ref 0.0–0.2)

## 2022-07-27 LAB — RESP PANEL BY RT-PCR (RSV, FLU A&B, COVID)  RVPGX2
Influenza A by PCR: NEGATIVE
Influenza B by PCR: NEGATIVE
Resp Syncytial Virus by PCR: NEGATIVE
SARS Coronavirus 2 by RT PCR: NEGATIVE

## 2022-07-27 LAB — TROPONIN I (HIGH SENSITIVITY)
Troponin I (High Sensitivity): 8 ng/L (ref <18)
Troponin I (High Sensitivity): 8 ng/L (ref <18)

## 2022-07-27 LAB — BASIC METABOLIC PANEL
Anion gap: 10 (ref 5–15)
BUN: 25 mg/dL — ABNORMAL HIGH (ref 8–23)
CO2: 23 mmol/L (ref 22–32)
Calcium: 10.7 mg/dL — ABNORMAL HIGH (ref 8.9–10.3)
Chloride: 106 mmol/L (ref 98–111)
Creatinine, Ser: 0.78 mg/dL (ref 0.44–1.00)
GFR, Estimated: >60 mL/min (ref >60)
Glucose, Bld: 178 mg/dL — ABNORMAL HIGH (ref 70–99)
Potassium: 4.1 mmol/L (ref 3.5–5.1)
Sodium: 139 mmol/L (ref 135–145)

## 2022-07-27 MED ORDER — TRAMADOL HCL 50 MG PO TABS: 50.0000 mg | ORAL_TABLET | Freq: Four times a day (QID) | ORAL | 0 refills | Status: AC | PRN

## 2022-07-27 NOTE — ED Provider Triage Note (Signed)
Emergency Medicine Provider Triage Evaluation Note  Victoria Boone , a 71 y.o. female  was evaluated in triage.  Pt complains of left sided chest pain since 10am this morning. No SOB. No nausea/diaphoresis. No leg swelling. Reports that her pain is worse with movement of her left arm.  Review of Systems  Positive: CP Negative: Abdominal pain, n/v, sob, cough, fever  Physical Exam  BP (!) 207/96 (BP Location: Right Arm)   Pulse 82   Temp 97.9 F (36.6 C) (Oral)   Resp 18   Ht '5\' 4"'$  (1.626 m)   Wt 100.7 kg   SpO2 98%   BMI 38.11 kg/m  Gen:   Awake, no distress   Resp:  Normal effort  MSK:   Moves extremities without difficulty  Other:  Blood pressures <20 point difference between arms  Medical Decision Making  Medically screening exam initiated at 12:06 PM.  Appropriate orders placed.  DEIDRE CARINO was informed that the remainder of the evaluation will be completed by another provider, this initial triage assessment does not replace that evaluation, and the importance of remaining in the ED until their evaluation is complete.     Marquette Old, PA-C 07/27/22 1208

## 2022-07-27 NOTE — ED Provider Notes (Signed)
[facility] Provider Note    Event Date/Time   First MD Initiated Contact with Patient 07/27/22 1710     (approximate)  History   Chief Complaint: Chest Pain  HPI  Victoria Boone is a 71 y.o. female with a past medical history of arthritis, diabetes, hypertension, hyperlipidemia presents to the emergency department for chest pain.  According to the patient over the past several days she has been experiencing intermittent pain in the left chest especially with movement such as lifting coughing or deep breath.  Patient states she has been coughing over the past few days.  Denies any fever.  Also states she has been lifting her 18-year-old grandchildren and states pain when she does that as well.  Patient states this morning she is having more significant pain she was concerned so she came to the emergency department for evaluation.  Describes the pain more as a constant sharp pain located next to the left breast under the left armpit.  Physical Exam   Triage Vital Signs: ED Triage Vitals  Enc Vitals Group     BP 07/27/22 1201 (!) 207/96     Pulse Rate 07/27/22 1201 82     Resp 07/27/22 1201 18     Temp 07/27/22 1201 97.9 F (36.6 C)     Temp Source 07/27/22 1201 Oral     SpO2 07/27/22 1201 98 %     Weight 07/27/22 1159 222 lb (100.7 kg)     Height 07/27/22 1159 '5\' 4"'$  (1.626 m)     Head Circumference --      Peak Flow --      Pain Score 07/27/22 1159 9     Pain Loc --      Pain Edu? --      Excl. in [place]? --     Most recent vital signs: Vitals:   07/27/22 1207 07/27/22 1710  BP: (!) 190/88 (!) 186/83  Pulse:  87  Resp:  16  Temp:  98.3 F (36.8 C)  SpO2:  98%    General: Awake, no distress.  CV:  Good peripheral perfusion.  Regular rate and rhythm  Resp:  Normal effort.  Equal breath sounds bilaterally.  Chest is tender to palpation in the left axillary area. Abd:  No distention.  Soft, nontender.  No rebound or guarding.   ED Results /  Procedures / Treatments   EKG  EKG viewed and interpreted by myself shows normal sinus rhythm 83 bpm with a narrow QRS, normal axis, normal intervals, no concerning ST changes.  RADIOLOGY  I have reviewed and interpreted the chest x-ray images.  No consolidation seen on my evaluation. Radiology has read the chest x-ray as negative.  MEDICATIONS ORDERED IN ED: Medications - No data to display   IMPRESSION / MDM / [place] / ED COURSE  I reviewed the triage vital signs and the nursing notes.  Patient's presentation is most consistent with acute presentation with potential threat to life or bodily function.  Patient presents emergency department for left-sided chest pain, worse with movement breathing coughing or palpation.  Patient states she has been coughing.  Patient's workup is reassuring including negative troponin x 2, CBC is normal, chemistry is normal.  Patient's EKG is reassuring and chest x-ray is clear.  Patient is hypertensive but otherwise reassuring vitals within normal room air saturation of 98% afebrile and her normal pulse rate and respiratory rate.  Patient's symptoms are very suggestive of musculoskeletal  or chest wall pain especially given the tenderness to palpation and ability to recreate the pain with movement especially lifting.  However given the pleuritic description I did discuss the possibility of a pulmonary embolus as well as option to proceed with CT scan or D-dimer to rule out PE.  Patient has been in the emergency department 5 and half hours, would like to hold off on further testing.  Patient is very reassured given the negative cardiac workup.  Patient states she will follow-up with her doctor.  I will prescribe her a short course of tramadol to be used especially at night if she needs it for discomfort.  FINAL CLINICAL IMPRESSION(S) / ED DIAGNOSES   Chest wall pain Chest pain  Rx / DC Orders   Tramadol PCP follow-up Strict return  precautions  Note:  This document was prepared using Dragon voice recognition software and may include unintentional dictation errors.   Harvest Dark, MD 07/27/22 1736

## 2022-07-27 NOTE — ED Triage Notes (Signed)
Patient arrives in wheelchair with significant other by POV c/o left chest/ breast under armpit pain since waking up around 10am this morning. Patient reports taking 2 aspirin and daily meds. Describes pain as constant and sharp. Worse with movement. Reports cough few days ago and feeling hoarse today.

## 2022-07-27 NOTE — Discharge Instructions (Addendum)
As we discussed please use your prescribed medication as needed for discomfort.  Please follow-up with your doctor in the next number days for recheck/reevaluation.  Return to the emergency department for any worsening chest pain especially if associated with trouble breathing or sweating or fever.  You may use over-the-counter Tylenol or ibuprofen as needed for discomfort or heat pad if you find this helpful.

## 2022-07-27 NOTE — ED Notes (Signed)
Left side pain under left arm for 2 weeks, but this time it does not go away .  Hurts to breath or move.  Says she has decreased appetitie as well.

## 2022-09-24 ENCOUNTER — Other Ambulatory Visit: Payer: Self-pay | Admitting: Internal Medicine

## 2022-09-24 DIAGNOSIS — Z1231 Encounter for screening mammogram for malignant neoplasm of breast: Secondary | ICD-10-CM

## 2022-11-12 ENCOUNTER — Ambulatory Visit
Admission: RE | Admit: 2022-11-12 | Discharge: 2022-11-12 | Disposition: A | Discharge status: Home / Self Care | Payer: Medicare HMO | Source: Ambulatory Visit | Attending: Internal Medicine | Admitting: Internal Medicine

## 2022-11-12 DIAGNOSIS — Z1231 Encounter for screening mammogram for malignant neoplasm of breast: Secondary | ICD-10-CM | POA: Diagnosis present

## 2022-11-16 ENCOUNTER — Other Ambulatory Visit: Payer: Self-pay | Admitting: Internal Medicine

## 2022-11-16 DIAGNOSIS — R928 Other abnormal and inconclusive findings on diagnostic imaging of breast: Secondary | ICD-10-CM

## 2022-11-16 DIAGNOSIS — N63 Unspecified lump in unspecified breast: Secondary | ICD-10-CM

## 2022-11-27 ENCOUNTER — Ambulatory Visit
Admission: RE | Admit: 2022-11-27 | Discharge: 2022-11-27 | Disposition: A | Discharge status: Home / Self Care | Payer: Medicare HMO | Source: Ambulatory Visit | Attending: Internal Medicine | Admitting: Internal Medicine

## 2022-11-27 DIAGNOSIS — R928 Other abnormal and inconclusive findings on diagnostic imaging of breast: Secondary | ICD-10-CM | POA: Insufficient documentation

## 2022-11-27 DIAGNOSIS — N63 Unspecified lump in unspecified breast: Secondary | ICD-10-CM

## 2023-11-01 ENCOUNTER — Other Ambulatory Visit: Payer: Self-pay | Admitting: Internal Medicine

## 2023-11-01 DIAGNOSIS — Z1231 Encounter for screening mammogram for malignant neoplasm of breast: Secondary | ICD-10-CM

## 2023-11-21 ENCOUNTER — Ambulatory Visit
Admission: RE | Admit: 2023-11-21 | Discharge: 2023-11-21 | Disposition: A | Discharge status: Home / Self Care | Source: Ambulatory Visit | Attending: Internal Medicine | Admitting: Internal Medicine

## 2023-11-21 DIAGNOSIS — Z1231 Encounter for screening mammogram for malignant neoplasm of breast: Secondary | ICD-10-CM | POA: Insufficient documentation
